# Patient Record
Sex: Female | Born: 1970 | Race: Black or African American | Hispanic: No | Marital: Married | Smoking: Never smoker
Health system: Southern US, Community
[De-identification: ages and names within clinical notes are randomized; demographics above are authoritative.]

## PROBLEM LIST (undated history)

## (undated) DIAGNOSIS — K219 Gastro-esophageal reflux disease without esophagitis: Secondary | ICD-10-CM

## (undated) HISTORY — PX: CRYOTHERAPY: SHX1416

## (undated) HISTORY — PX: OTHER SURGICAL HISTORY: SHX169

---

## 1999-05-13 ENCOUNTER — Emergency Department (HOSPITAL_COMMUNITY): Admission: EM | Admit: 1999-05-13 | Discharge: 1999-05-13 | Payer: Self-pay | Admitting: Emergency Medicine

## 1999-11-08 ENCOUNTER — Emergency Department (HOSPITAL_COMMUNITY): Admission: EM | Admit: 1999-11-08 | Discharge: 1999-11-08 | Payer: Self-pay | Admitting: Emergency Medicine

## 2002-11-28 ENCOUNTER — Inpatient Hospital Stay (HOSPITAL_COMMUNITY): Admission: AD | Admit: 2002-11-28 | Discharge: 2002-11-28 | Payer: Self-pay | Admitting: Obstetrics and Gynecology

## 2003-04-09 ENCOUNTER — Encounter: Admission: RE | Admit: 2003-04-09 | Discharge: 2003-04-09 | Payer: Self-pay | Admitting: Obstetrics & Gynecology

## 2004-09-25 ENCOUNTER — Other Ambulatory Visit: Admission: RE | Admit: 2004-09-25 | Discharge: 2004-09-25 | Payer: Self-pay | Admitting: Obstetrics and Gynecology

## 2005-07-26 ENCOUNTER — Other Ambulatory Visit: Admission: RE | Admit: 2005-07-26 | Discharge: 2005-07-26 | Payer: Self-pay | Admitting: Family Medicine

## 2005-08-15 ENCOUNTER — Ambulatory Visit (HOSPITAL_COMMUNITY): Admission: RE | Admit: 2005-08-15 | Discharge: 2005-08-15 | Payer: Self-pay | Admitting: Family Medicine

## 2006-04-01 ENCOUNTER — Ambulatory Visit (HOSPITAL_COMMUNITY): Admission: RE | Admit: 2006-04-01 | Discharge: 2006-04-01 | Payer: Self-pay | Admitting: Obstetrics and Gynecology

## 2007-07-14 ENCOUNTER — Emergency Department (HOSPITAL_COMMUNITY): Admission: EM | Admit: 2007-07-14 | Discharge: 2007-07-14 | Payer: Self-pay

## 2009-07-09 ENCOUNTER — Emergency Department (HOSPITAL_COMMUNITY): Admission: EM | Admit: 2009-07-09 | Discharge: 2009-07-09 | Payer: Self-pay | Admitting: Emergency Medicine

## 2009-08-18 ENCOUNTER — Ambulatory Visit (HOSPITAL_COMMUNITY): Admission: RE | Admit: 2009-08-18 | Discharge: 2009-08-18 | Payer: Self-pay | Admitting: Obstetrics & Gynecology

## 2010-07-17 ENCOUNTER — Ambulatory Visit: Payer: Self-pay | Admitting: Nurse Practitioner

## 2010-07-17 ENCOUNTER — Inpatient Hospital Stay (HOSPITAL_COMMUNITY): Admission: AD | Admit: 2010-07-17 | Discharge: 2010-07-17 | Payer: Self-pay | Admitting: Family Medicine

## 2010-11-11 ENCOUNTER — Encounter: Payer: Self-pay | Admitting: Family Medicine

## 2010-11-11 ENCOUNTER — Encounter: Payer: Self-pay | Admitting: Obstetrics and Gynecology

## 2011-01-04 LAB — URINALYSIS, ROUTINE W REFLEX MICROSCOPIC
Bilirubin Urine: NEGATIVE
Glucose, UA: NEGATIVE mg/dL
Ketones, ur: NEGATIVE mg/dL
Leukocytes, UA: NEGATIVE
Nitrite: NEGATIVE
Protein, ur: NEGATIVE mg/dL
Specific Gravity, Urine: 1.01 (ref 1.005–1.030)
Urobilinogen, UA: 0.2 mg/dL (ref 0.0–1.0)
pH: 6 (ref 5.0–8.0)

## 2011-01-04 LAB — WET PREP, GENITAL
Clue Cells Wet Prep HPF POC: NONE SEEN
Trich, Wet Prep: NONE SEEN
Yeast Wet Prep HPF POC: NONE SEEN

## 2011-01-04 LAB — GC/CHLAMYDIA PROBE AMP, GENITAL
Chlamydia, DNA Probe: NEGATIVE
GC Probe Amp, Genital: NEGATIVE

## 2011-01-04 LAB — POCT PREGNANCY, URINE: Preg Test, Ur: POSITIVE

## 2011-01-04 LAB — CBC
HCT: 36.1 % (ref 36.0–46.0)
Hemoglobin: 12.4 g/dL (ref 12.0–15.0)
MCH: 32.6 pg (ref 26.0–34.0)
MCHC: 34.4 g/dL (ref 30.0–36.0)
MCV: 94.9 fL (ref 78.0–100.0)
Platelets: 179 10*3/uL (ref 150–400)
RBC: 3.8 MIL/uL — ABNORMAL LOW (ref 3.87–5.11)
RDW: 12.5 % (ref 11.5–15.5)
WBC: 8.2 10*3/uL (ref 4.0–10.5)

## 2011-01-04 LAB — URINE MICROSCOPIC-ADD ON

## 2011-01-04 LAB — HCG, QUANTITATIVE, PREGNANCY: hCG, Beta Chain, Quant, S: 39998 m[IU]/mL — ABNORMAL HIGH (ref <5)

## 2011-01-04 LAB — ABO/RH: ABO/RH(D): B POS

## 2011-03-15 ENCOUNTER — Inpatient Hospital Stay (HOSPITAL_COMMUNITY): Payer: Medicaid Other

## 2011-03-15 ENCOUNTER — Inpatient Hospital Stay (HOSPITAL_COMMUNITY)
Admission: AD | Admit: 2011-03-15 | Discharge: 2011-03-17 | DRG: 775 | Disposition: A | Discharge status: Home / Self Care | Payer: Medicaid Other | Source: Ambulatory Visit | Attending: Obstetrics and Gynecology | Admitting: Obstetrics and Gynecology

## 2011-03-15 DIAGNOSIS — D259 Leiomyoma of uterus, unspecified: Secondary | ICD-10-CM | POA: Diagnosis present

## 2011-03-15 DIAGNOSIS — O365990 Maternal care for other known or suspected poor fetal growth, unspecified trimester, not applicable or unspecified: Secondary | ICD-10-CM | POA: Diagnosis present

## 2011-03-15 DIAGNOSIS — O34599 Maternal care for other abnormalities of gravid uterus, unspecified trimester: Secondary | ICD-10-CM | POA: Diagnosis present

## 2011-03-15 DIAGNOSIS — D4959 Neoplasm of unspecified behavior of other genitourinary organ: Secondary | ICD-10-CM | POA: Diagnosis present

## 2011-03-15 DIAGNOSIS — O09529 Supervision of elderly multigravida, unspecified trimester: Secondary | ICD-10-CM | POA: Diagnosis present

## 2011-03-15 LAB — COMPREHENSIVE METABOLIC PANEL
ALT: 23 U/L (ref 0–35)
AST: 19 U/L (ref 0–37)
Albumin: 2.9 g/dL — ABNORMAL LOW (ref 3.5–5.2)
Alkaline Phosphatase: 126 U/L — ABNORMAL HIGH (ref 39–117)
BUN: 8 mg/dL (ref 6–23)
CO2: 23 mEq/L (ref 19–32)
Calcium: 8.9 mg/dL (ref 8.4–10.5)
Chloride: 103 mEq/L (ref 96–112)
Creatinine, Ser: 0.58 mg/dL (ref 0.4–1.2)
GFR calc Af Amer: >60 mL/min (ref >60)
GFR calc non Af Amer: >60 mL/min (ref >60)
Glucose, Bld: 86 mg/dL (ref 70–99)
Potassium: 3.8 mEq/L (ref 3.5–5.1)
Sodium: 135 mEq/L (ref 135–145)
Total Bilirubin: 0.2 mg/dL — ABNORMAL LOW (ref 0.3–1.2)
Total Protein: 5.9 g/dL — ABNORMAL LOW (ref 6.0–8.3)

## 2011-03-15 LAB — CBC
HCT: 37 % (ref 36.0–46.0)
HCT: 37.1 % (ref 36.0–46.0)
Hemoglobin: 12.5 g/dL (ref 12.0–15.0)
Hemoglobin: 12.8 g/dL (ref 12.0–15.0)
MCH: 31.2 pg (ref 26.0–34.0)
MCH: 31.7 pg (ref 26.0–34.0)
MCHC: 33.8 g/dL (ref 30.0–36.0)
MCHC: 34.5 g/dL (ref 30.0–36.0)
MCV: 92.3 fL (ref 78.0–100.0)
MCV: 91.8 fL (ref 78.0–100.0)
Platelets: 155 10*3/uL (ref 150–400)
Platelets: 157 10*3/uL (ref 150–400)
RBC: 4.01 MIL/uL (ref 3.87–5.11)
RBC: 4.04 MIL/uL (ref 3.87–5.11)
RDW: 12.9 % (ref 11.5–15.5)
RDW: 13 % (ref 11.5–15.5)
WBC: 8 10*3/uL (ref 4.0–10.5)
WBC: 9.6 10*3/uL (ref 4.0–10.5)

## 2011-03-15 LAB — RPR: RPR Ser Ql: NONREACTIVE

## 2011-03-16 LAB — ABO/RH: ABO/RH(D): B POS

## 2011-03-16 LAB — CBC
HCT: 34.8 % — ABNORMAL LOW (ref 36.0–46.0)
Hemoglobin: 11.5 g/dL — ABNORMAL LOW (ref 12.0–15.0)
MCHC: 33 g/dL (ref 30.0–36.0)
RBC: 3.75 MIL/uL — ABNORMAL LOW (ref 3.87–5.11)
WBC: 14.2 10*3/uL — ABNORMAL HIGH (ref 4.0–10.5)

## 2011-05-25 IMAGING — CR DG FOOT COMPLETE 3+V*L*
3 series · 3 of 3 positions shown · non-contrast
Comparison: None

CLINICAL DATA: Left foot pain and swelling, about the second to
fourth metatarsophalangeal joints.

LEFT FOOT - COMPLETE 3+ VIEW

[t foot ap left]
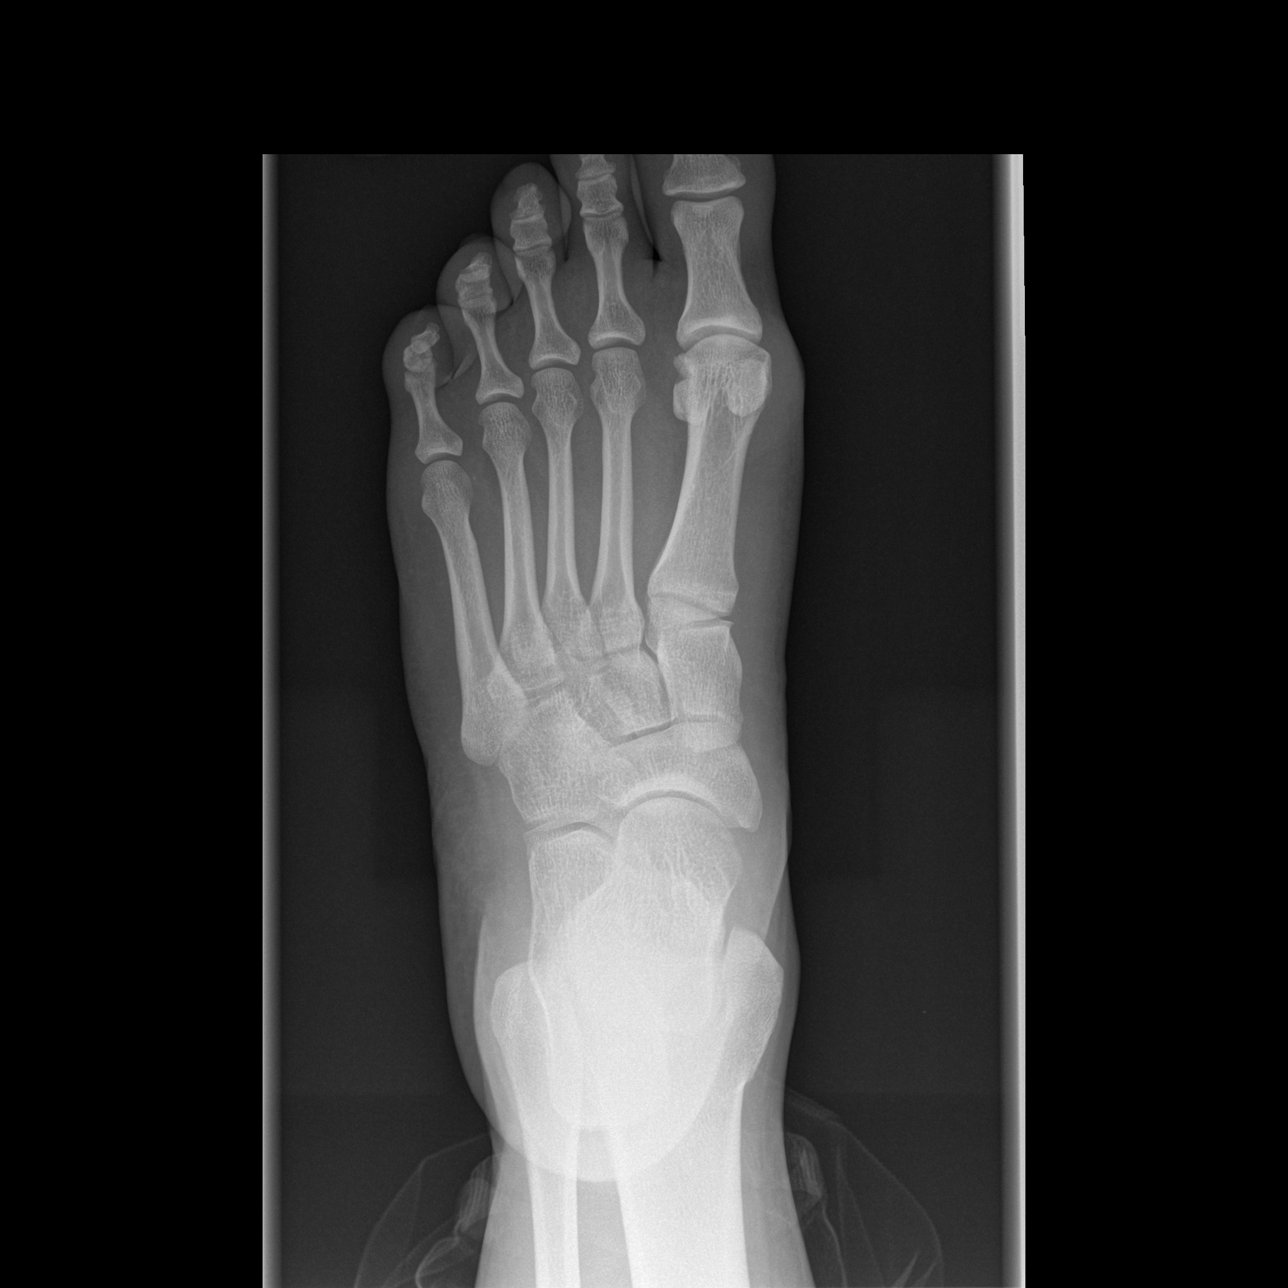

[t foot oblique left]
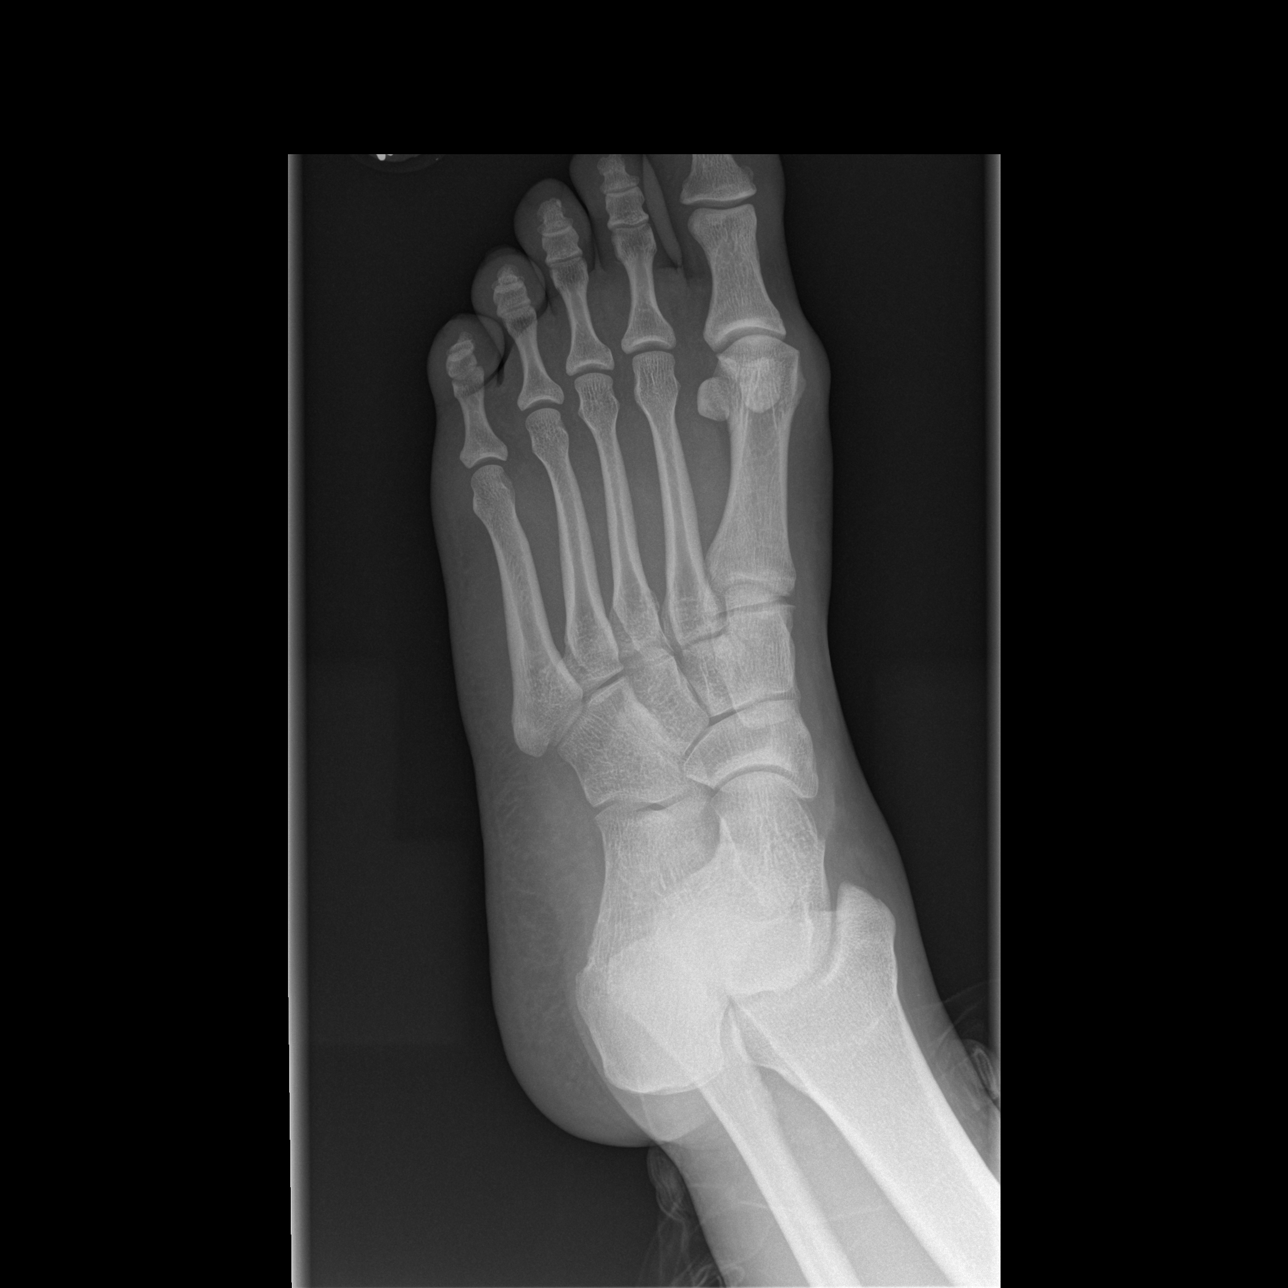

[t foot lat left]
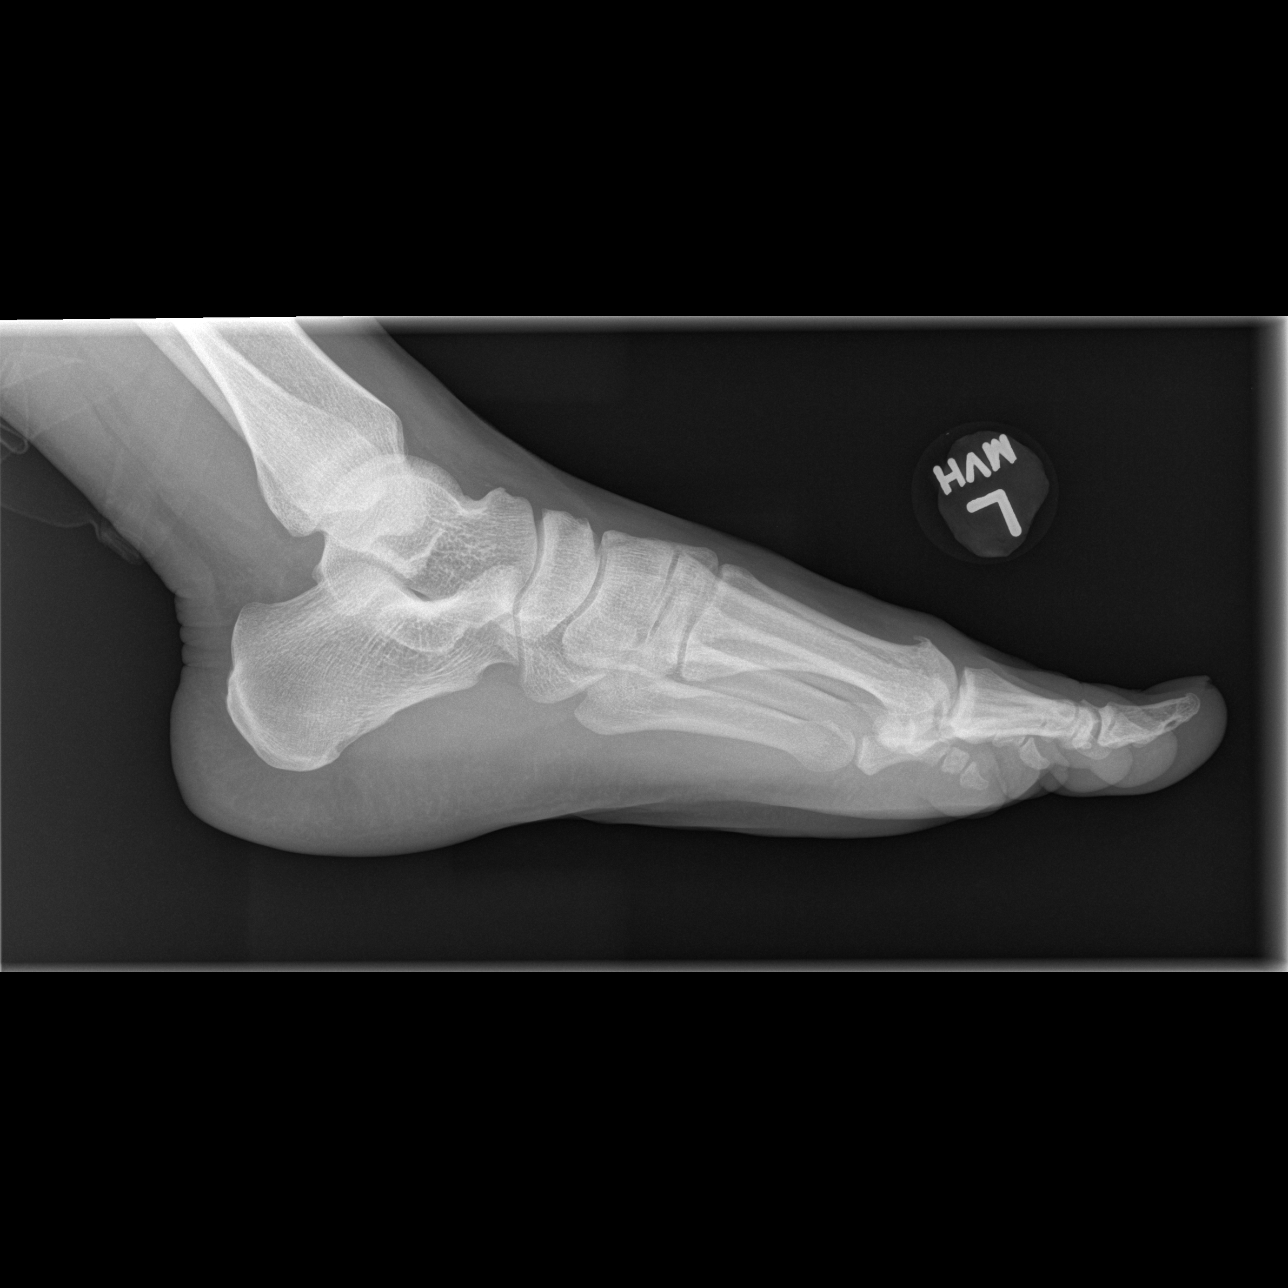

[3 of 3 positions shown; findings below may reference images not displayed]

FINDINGS: There is no evidence of fracture or dislocation.  The
joint spaces are preserved.  There is no evidence of talar
subluxation; the subtalar joint is unremarkable in appearance.

Mild soft tissue swelling may be present about the forefoot.
IMPRESSION: No evidence of fracture or dislocation.

## 2011-12-13 ENCOUNTER — Encounter (HOSPITAL_COMMUNITY): Payer: Self-pay | Admitting: Pharmacist

## 2011-12-26 ENCOUNTER — Encounter (HOSPITAL_COMMUNITY)
Admission: RE | Admit: 2011-12-26 | Discharge: 2011-12-26 | Disposition: A | Discharge status: Home / Self Care | Payer: 59 | Source: Ambulatory Visit | Attending: Obstetrics and Gynecology | Admitting: Obstetrics and Gynecology

## 2011-12-26 ENCOUNTER — Encounter (HOSPITAL_COMMUNITY): Payer: Self-pay

## 2011-12-26 HISTORY — DX: Gastro-esophageal reflux disease without esophagitis: K21.9

## 2011-12-26 LAB — CBC
Hemoglobin: 13 g/dL (ref 12.0–15.0)
MCH: 31.3 pg (ref 26.0–34.0)
MCV: 91.1 fL (ref 78.0–100.0)
Platelets: 173 10*3/uL (ref 150–400)
RBC: 4.16 MIL/uL (ref 3.87–5.11)
WBC: 4.9 10*3/uL (ref 4.0–10.5)

## 2011-12-26 NOTE — Patient Instructions (Addendum)
19 Brianna Fry  12/26/2011   Your procedure is scheduled on:  01/02/12  Enter through the Main Entrance of [facility] at 7 AM.  Pick up the phone at the desk and dial [phone].   Call this number if you have problems the morning of surgery: [phone]   Remember:   Do not eat food:After Midnight.  Do not drink clear liquids: After Midnight.  Take these medicines the morning of surgery with A SIP OF WATER: NA   Do not wear jewelry, make-up or nail polish.  Do not wear lotions, powders, or perfumes. You may wear deodorant.  Do not shave 48 hours prior to surgery.  Do not bring valuables to the hospital.  Contacts, dentures or bridgework may not be worn into surgery.  Leave suitcase in the car. After surgery it may be brought to your room.  For patients admitted to the hospital, checkout time is 11:00 AM the day of discharge.   Patients discharged the day of surgery will not be allowed to drive home.  Name and phone number of your driver: NA  Special Instructions: CHG Shower Use Special Wash: 1/2 bottle night before surgery and 1/2 bottle morning of surgery.   Please read over the following fact sheets that you were given: MRSA Information

## 2012-01-01 ENCOUNTER — Other Ambulatory Visit: Payer: Self-pay | Admitting: Obstetrics and Gynecology

## 2012-01-01 MED ORDER — CEFOTETAN DISODIUM 2 G IJ SOLR
2.0000 g | INTRAMUSCULAR | Status: AC
Start: 2012-01-02 — End: 2012-01-02
  Administered 2012-01-02: 2 g via INTRAVENOUS
  Filled 2012-01-01: qty 2

## 2012-01-01 NOTE — H&P (Signed)
41 y.o. yo complains of symptomatic fibroid uterus.  Pt c/o some urinary sx associated with enlarging fibroids.  She has  menorrhagia and clots with periods.  She is also having dyspareunia.  Past Medical History  Diagnosis Date  . GERD (gastroesophageal reflux disease)     controlled now with diet   Past Surgical History  Procedure Date  . Cryotherapy     History   Social History  . Marital Status: Married    Spouse Name: N/A    Number of Children: N/A  . Years of Education: N/A   Occupational History  . Not on file.   Social History Main Topics  . Smoking status: Never Smoker   . Smokeless tobacco: Not on file  . Alcohol Use: Yes  . Drug Use: No  . Sexually Active:    Other Topics Concern  . Not on file   Social History Narrative  . No narrative on file    No current facility-administered medications on file prior to encounter.   No current outpatient prescriptions on file prior to encounter.    No Known Allergies  Filed Vitals:   01/02/12 0737  BP: 115/72  Pulse: 70  Temp: 98.2 F (36.8 C)  Resp: 18     Lungs: clear to ascultation Cor:  RRR Abdomen:  soft, nontender, nondistended. Ex:  no cords, erythema Pelvic:  14 week size, mobile uterus w/o discreet masses.  Cervix is clear.  CBC    Component Value Date/Time   WBC 4.9 12/26/2011 1157   RBC 4.16 12/26/2011 1157   HGB 13.0 12/26/2011 1157   HCT 37.9 12/26/2011 1157   PLT 173 12/26/2011 1157   MCV 91.1 12/26/2011 1157   MCH 31.3 12/26/2011 1157   MCHC 34.3 12/26/2011 1157   RDW 12.0 12/26/2011 1157      U/S  During pregnancy, multiple fibroids, largest 10cm in fundus.  Two 5 cm fibroids- some pedunculated.  A: Symptomatic fibroid uterus.  P:  For RoboticTLH.   All risks, benefits and alternatives d/w patient and she desires to proceed .  Patient has undergone a modified bowel prep and will receive preop antibiotics and SCDs during the operation.     Jilleen Essner A

## 2012-01-02 ENCOUNTER — Encounter (HOSPITAL_COMMUNITY): Payer: Self-pay | Admitting: Anesthesiology

## 2012-01-02 ENCOUNTER — Encounter (HOSPITAL_COMMUNITY)
Admission: RE | Disposition: A | Discharge status: Home / Self Care | Payer: Self-pay | Source: Ambulatory Visit | Attending: Obstetrics and Gynecology

## 2012-01-02 ENCOUNTER — Ambulatory Visit (HOSPITAL_COMMUNITY)
Admission: RE | Admit: 2012-01-02 | Discharge: 2012-01-03 | Disposition: A | Discharge status: Home / Self Care | Payer: 59 | Source: Ambulatory Visit | Attending: Obstetrics and Gynecology | Admitting: Obstetrics and Gynecology

## 2012-01-02 ENCOUNTER — Encounter (HOSPITAL_COMMUNITY): Payer: Self-pay | Admitting: *Deleted

## 2012-01-02 ENCOUNTER — Ambulatory Visit (HOSPITAL_COMMUNITY): Payer: 59 | Admitting: Anesthesiology

## 2012-01-02 DIAGNOSIS — IMO0002 Reserved for concepts with insufficient information to code with codable children: Secondary | ICD-10-CM | POA: Insufficient documentation

## 2012-01-02 DIAGNOSIS — D250 Submucous leiomyoma of uterus: Secondary | ICD-10-CM | POA: Insufficient documentation

## 2012-01-02 DIAGNOSIS — Z01818 Encounter for other preprocedural examination: Secondary | ICD-10-CM | POA: Insufficient documentation

## 2012-01-02 DIAGNOSIS — N8000 Endometriosis of the uterus, unspecified: Secondary | ICD-10-CM | POA: Insufficient documentation

## 2012-01-02 DIAGNOSIS — Z01812 Encounter for preprocedural laboratory examination: Secondary | ICD-10-CM | POA: Insufficient documentation

## 2012-01-02 DIAGNOSIS — Z9889 Other specified postprocedural states: Secondary | ICD-10-CM

## 2012-01-02 DIAGNOSIS — D252 Subserosal leiomyoma of uterus: Secondary | ICD-10-CM | POA: Insufficient documentation

## 2012-01-02 DIAGNOSIS — D251 Intramural leiomyoma of uterus: Secondary | ICD-10-CM | POA: Insufficient documentation

## 2012-01-02 DIAGNOSIS — N920 Excessive and frequent menstruation with regular cycle: Secondary | ICD-10-CM | POA: Insufficient documentation

## 2012-01-02 HISTORY — PX: ROBOTIC ASSISTED LAP VAGINAL HYSTERECTOMY: SHX2362

## 2012-01-02 LAB — PREGNANCY, URINE: Preg Test, Ur: NEGATIVE

## 2012-01-02 SURGERY — ROBOTIC ASSISTED LAPAROSCOPIC VAGINAL HYSTERECTOMY
Anesthesia: General | Site: Abdomen | Wound class: Clean Contaminated

## 2012-01-02 MED ORDER — LACTATED RINGERS IV SOLN
INTRAVENOUS | Status: DC
  Administered 2012-01-02: 11:00:00 via INTRAVENOUS
  Administered 2012-01-02: 125 mL/h via INTRAVENOUS

## 2012-01-02 MED ORDER — HYDROMORPHONE HCL PF 1 MG/ML IJ SOLN
0.2500 mg | INTRAMUSCULAR | Status: DC | PRN
  Administered 2012-01-02 (×2): 0.5 mg via INTRAVENOUS

## 2012-01-02 MED ORDER — ONDANSETRON HCL 4 MG/2ML IJ SOLN
INTRAMUSCULAR | Status: DC | PRN
  Administered 2012-01-02: 4 mg via INTRAVENOUS

## 2012-01-02 MED ORDER — MIDAZOLAM HCL 2 MG/2ML IJ SOLN
INTRAMUSCULAR | Status: AC
  Filled 2012-01-02: qty 2

## 2012-01-02 MED ORDER — ROCURONIUM BROMIDE 100 MG/10ML IV SOLN
INTRAVENOUS | Status: DC | PRN
  Administered 2012-01-02: 5 mg via INTRAVENOUS
  Administered 2012-01-02: 20 mg via INTRAVENOUS
  Administered 2012-01-02: 10 mg via INTRAVENOUS
  Administered 2012-01-02: 5 mg via INTRAVENOUS
  Administered 2012-01-02: 35 mg via INTRAVENOUS

## 2012-01-02 MED ORDER — LIDOCAINE HCL (CARDIAC) 20 MG/ML IV SOLN
INTRAVENOUS | Status: DC | PRN
  Administered 2012-01-02: 80 mg via INTRAVENOUS

## 2012-01-02 MED ORDER — KETOROLAC TROMETHAMINE 30 MG/ML IJ SOLN: 15.0000 mg | Freq: Once | INTRAMUSCULAR | Status: DC | PRN

## 2012-01-02 MED ORDER — ONDANSETRON HCL 4 MG/2ML IJ SOLN
INTRAMUSCULAR | Status: AC
  Filled 2012-01-02: qty 2

## 2012-01-02 MED ORDER — GLYCOPYRROLATE 0.2 MG/ML IJ SOLN
INTRAMUSCULAR | Status: AC
  Filled 2012-01-02: qty 1

## 2012-01-02 MED ORDER — MIDAZOLAM HCL 5 MG/5ML IJ SOLN
INTRAMUSCULAR | Status: DC | PRN
  Administered 2012-01-02: 2 mg via INTRAVENOUS

## 2012-01-02 MED ORDER — KETOROLAC TROMETHAMINE 30 MG/ML IJ SOLN
INTRAMUSCULAR | Status: DC | PRN
  Administered 2012-01-02: 30 mg via INTRAVENOUS

## 2012-01-02 MED ORDER — MENTHOL 3 MG MT LOZG: 1.0000 | LOZENGE | OROMUCOSAL | Status: DC | PRN

## 2012-01-02 MED ORDER — ONDANSETRON HCL 4 MG/2ML IJ SOLN: 4.0000 mg | Freq: Four times a day (QID) | INTRAMUSCULAR | Status: DC | PRN

## 2012-01-02 MED ORDER — ZOLPIDEM TARTRATE 5 MG PO TABS
5.0000 mg | ORAL_TABLET | Freq: Every evening | ORAL | Status: DC | PRN
  Filled 2012-01-02: qty 5

## 2012-01-02 MED ORDER — NEOSTIGMINE METHYLSULFATE 1 MG/ML IJ SOLN
INTRAMUSCULAR | Status: DC | PRN
  Administered 2012-01-02: 5 mg via INTRAVENOUS

## 2012-01-02 MED ORDER — DEXAMETHASONE SODIUM PHOSPHATE 10 MG/ML IJ SOLN
INTRAMUSCULAR | Status: AC
  Filled 2012-01-02: qty 1

## 2012-01-02 MED ORDER — ONDANSETRON HCL 4 MG PO TABS: 4.0000 mg | ORAL_TABLET | Freq: Four times a day (QID) | ORAL | Status: DC | PRN

## 2012-01-02 MED ORDER — LACTATED RINGERS IR SOLN
Status: DC | PRN
  Administered 2012-01-02: 3000 mL

## 2012-01-02 MED ORDER — KETOROLAC TROMETHAMINE 30 MG/ML IJ SOLN: 30.0000 mg | Freq: Four times a day (QID) | INTRAMUSCULAR | Status: DC

## 2012-01-02 MED ORDER — HYDROMORPHONE HCL PF 1 MG/ML IJ SOLN
INTRAMUSCULAR | Status: AC
  Administered 2012-01-02: 0.5 mg via INTRAVENOUS
  Filled 2012-01-02: qty 1

## 2012-01-02 MED ORDER — GLYCOPYRROLATE 0.2 MG/ML IJ SOLN
INTRAMUSCULAR | Status: DC | PRN
  Administered 2012-01-02: .5 mg via INTRAVENOUS

## 2012-01-02 MED ORDER — FENTANYL CITRATE 0.05 MG/ML IJ SOLN
INTRAMUSCULAR | Status: DC | PRN
  Administered 2012-01-02: 100 ug via INTRAVENOUS
  Administered 2012-01-02: 150 ug via INTRAVENOUS

## 2012-01-02 MED ORDER — PROMETHAZINE HCL 25 MG/ML IJ SOLN: 6.2500 mg | INTRAMUSCULAR | Status: DC | PRN

## 2012-01-02 MED ORDER — IBUPROFEN 800 MG PO TABS: 800.0000 mg | ORAL_TABLET | Freq: Three times a day (TID) | ORAL | Status: DC | PRN

## 2012-01-02 MED ORDER — FENTANYL CITRATE 0.05 MG/ML IJ SOLN: 25.0000 ug | INTRAMUSCULAR | Status: DC | PRN

## 2012-01-02 MED ORDER — KETOROLAC TROMETHAMINE 30 MG/ML IJ SOLN
INTRAMUSCULAR | Status: AC
  Filled 2012-01-02: qty 1

## 2012-01-02 MED ORDER — PROPOFOL 10 MG/ML IV EMUL
INTRAVENOUS | Status: DC | PRN
  Administered 2012-01-02: 130 mg via INTRAVENOUS
  Administered 2012-01-02: 20 mg via INTRAVENOUS

## 2012-01-02 MED ORDER — NEOSTIGMINE METHYLSULFATE 1 MG/ML IJ SOLN
INTRAMUSCULAR | Status: AC
  Filled 2012-01-02: qty 10

## 2012-01-02 MED ORDER — PROPOFOL 10 MG/ML IV EMUL
INTRAVENOUS | Status: AC
  Filled 2012-01-02: qty 20

## 2012-01-02 MED ORDER — FENTANYL CITRATE 0.05 MG/ML IJ SOLN
INTRAMUSCULAR | Status: AC
  Filled 2012-01-02: qty 5

## 2012-01-02 MED ORDER — MEPERIDINE HCL 25 MG/ML IJ SOLN: 6.2500 mg | INTRAMUSCULAR | Status: DC | PRN

## 2012-01-02 MED ORDER — DEXAMETHASONE SODIUM PHOSPHATE 4 MG/ML IJ SOLN
INTRAMUSCULAR | Status: DC | PRN
  Administered 2012-01-02: 10 mg via INTRAVENOUS

## 2012-01-02 MED ORDER — OXYCODONE-ACETAMINOPHEN 5-325 MG PO TABS
1.0000 | ORAL_TABLET | ORAL | Status: DC | PRN
  Administered 2012-01-02: 2 via ORAL
  Filled 2012-01-02: qty 2

## 2012-01-02 MED ORDER — LIDOCAINE HCL (CARDIAC) 20 MG/ML IV SOLN
INTRAVENOUS | Status: AC
  Filled 2012-01-02: qty 5

## 2012-01-02 MED ORDER — ROCURONIUM BROMIDE 50 MG/5ML IV SOLN
INTRAVENOUS | Status: AC
  Filled 2012-01-02: qty 1

## 2012-01-02 SURGICAL SUPPLY — 68 items
BAG URINE DRAINAGE (UROLOGICAL SUPPLIES) ×2 IMPLANT
BARRIER ADHS 3X4 INTERCEED (GAUZE/BANDAGES/DRESSINGS) IMPLANT
BENZOIN TINCTURE PRP APPL 2/3 (GAUZE/BANDAGES/DRESSINGS) IMPLANT
BLADELESS LONG 8MM (BLADE) ×2 IMPLANT
CABLE HIGH FREQUENCY MONO STRZ (ELECTRODE) ×2 IMPLANT
CATH FOLEY 3WAY  5CC 16FR (CATHETERS) ×1
CATH FOLEY 3WAY 5CC 16FR (CATHETERS) ×1 IMPLANT
CHLORAPREP W/TINT 26ML (MISCELLANEOUS) ×2 IMPLANT
CLOTH BEACON ORANGE TIMEOUT ST (SAFETY) ×2 IMPLANT
CONT PATH 16OZ SNAP LID 3702 (MISCELLANEOUS) ×2 IMPLANT
COVER MAYO STAND STRL (DRAPES) ×2 IMPLANT
COVER TABLE BACK 60X90 (DRAPES) ×4 IMPLANT
COVER TIP SHEARS 8 DVNC (MISCELLANEOUS) ×1 IMPLANT
COVER TIP SHEARS 8MM DA VINCI (MISCELLANEOUS) ×1
DECANTER SPIKE VIAL GLASS SM (MISCELLANEOUS) IMPLANT
DERMABOND ADVANCED (GAUZE/BANDAGES/DRESSINGS) ×1
DERMABOND ADVANCED .7 DNX12 (GAUZE/BANDAGES/DRESSINGS) ×1 IMPLANT
DRAPE HUG U DISPOSABLE (DRAPE) ×2 IMPLANT
DRAPE HYSTEROSCOPY (DRAPE) ×2 IMPLANT
DRAPE LG THREE QUARTER DISP (DRAPES) ×4 IMPLANT
DRAPE MONITOR DA VINCI (DRAPE) IMPLANT
DRAPE WARM FLUID 44X44 (DRAPE) ×2 IMPLANT
ELECT REM PT RETURN 9FT ADLT (ELECTROSURGICAL) ×2
ELECTRODE REM PT RTRN 9FT ADLT (ELECTROSURGICAL) ×1 IMPLANT
EVACUATOR SMOKE 8.L (FILTER) ×2 IMPLANT
GAUZE VASELINE 3X9 (GAUZE/BANDAGES/DRESSINGS) IMPLANT
GLOVE BIO SURGEON STRL SZ7 (GLOVE) ×6 IMPLANT
GLOVE ECLIPSE 6.5 STRL STRAW (GLOVE) ×6 IMPLANT
GOWN PREVENTION PLUS LG XLONG (DISPOSABLE) ×8 IMPLANT
GOWN STRL REIN XL XLG (GOWN DISPOSABLE) ×12 IMPLANT
KIT ACCESSORY DA VINCI DISP (KITS) ×1
KIT ACCESSORY DVNC DISP (KITS) ×1 IMPLANT
KIT DISP ACCESSORY 4 ARM (KITS) IMPLANT
MANIPULATOR UTERINE 4.5 ZUMI (MISCELLANEOUS) IMPLANT
NEEDLE INSUFFLATION 14GA 120MM (NEEDLE) ×2 IMPLANT
NS IRRIG 1000ML POUR BTL (IV SOLUTION) ×6 IMPLANT
OCCLUDER COLPOPNEUMO (BALLOONS) ×2 IMPLANT
PACK LAVH (CUSTOM PROCEDURE TRAY) ×2 IMPLANT
PAD PREP 24X48 CUFFED NSTRL (MISCELLANEOUS) ×4 IMPLANT
PLUG CATH AND CAP STER (CATHETERS) ×2 IMPLANT
POSITIONER SURGICAL ARM (MISCELLANEOUS) IMPLANT
PROTECTOR NERVE ULNAR (MISCELLANEOUS) ×4 IMPLANT
RUMI II 3.0CM BLUE KOH-EFFICIE (DISPOSABLE) ×2 IMPLANT
SET IRRIG TUBING LAPAROSCOPIC (IRRIGATION / IRRIGATOR) ×2 IMPLANT
SOLUTION ELECTROLUBE (MISCELLANEOUS) ×2 IMPLANT
SPONGE LAP 18X18 X RAY DECT (DISPOSABLE) IMPLANT
STRIP CLOSURE SKIN 1/2X4 (GAUZE/BANDAGES/DRESSINGS) IMPLANT
SUT VIC AB 0 CT1 27 (SUTURE) ×5
SUT VIC AB 0 CT1 27XBRD ANBCTR (SUTURE) ×5 IMPLANT
SUT VIC AB 2-0 CT2 27 (SUTURE) ×4 IMPLANT
SUT VICRYL 0 UR6 27IN ABS (SUTURE) ×4 IMPLANT
SUT VICRYL RAPIDE 3 0 (SUTURE) ×4 IMPLANT
SUT VLOC 180 0 9IN  GS21 (SUTURE) ×1
SUT VLOC 180 0 9IN GS21 (SUTURE) ×1 IMPLANT
SYR 50ML LL SCALE MARK (SYRINGE) ×2 IMPLANT
SYSTEM CONVERTIBLE TROCAR (TROCAR) ×2 IMPLANT
TIP UTERINE 5.1X6CM LAV DISP (MISCELLANEOUS) IMPLANT
TIP UTERINE 6.7X10CM GRN DISP (MISCELLANEOUS) IMPLANT
TIP UTERINE 6.7X6CM WHT DISP (MISCELLANEOUS) IMPLANT
TIP UTERINE 6.7X8CM BLUE DISP (MISCELLANEOUS) ×2 IMPLANT
TOWEL OR 17X24 6PK STRL BLUE (TOWEL DISPOSABLE) ×4 IMPLANT
TRAY FOLEY BAG SILVER LF 14FR (CATHETERS) IMPLANT
TROCAR DISP BLADELESS 8 DVNC (TROCAR) ×1 IMPLANT
TROCAR DISP BLADELESS 8MM (TROCAR) ×1
TROCAR XCEL 12X100 BLDLESS (ENDOMECHANICALS) ×2 IMPLANT
TROCAR Z-THREAD 12X150 (TROCAR) ×2 IMPLANT
TROCAR Z-THREAD BLADED 12X100M (TROCAR) IMPLANT
TUBING FILTER THERMOFLATOR (ELECTROSURGICAL) ×2 IMPLANT

## 2012-01-02 NOTE — Brief Op Note (Signed)
01/02/2012  11:09 AM  PATIENT:  Brianna Fry  41 y.o. female  PRE-OPERATIVE DIAGNOSIS:  UTERINE FIBROIDS  POST-OPERATIVE DIAGNOSIS:  uterine fibroids  PROCEDURE:  Procedure(s) (LRB): ROBOTIC ASSISTED LAPAROSCOPIC VAGINAL HYSTERECTOMY (N/A)   SURGEON:  Surgeon(s) and Role:    * Loney Laurence, MD - Primary    * Philip Aspen, DO - Assisting  ANESTHESIA:   general  EBL:  Total I/O In: 1000 [I.V.:1000] Out: -   SPECIMEN:  Source of Specimen:  uterus, cervix  DISPOSITION OF SPECIMEN:  PATHOLOGY  COUNTS:  YES  PLAN OF CARE: Admit for overnight observation  PATIENT DISPOSITION:  PACU - hemodynamically stable.   Delay start of Pharmacological VTE agent (>24hrs) due to surgical blood loss or risk of bleeding: not applicable  Preoperative Diagnosis:  Symptomatic fibroid uterus.  Postoperative Diagnosis:  Same.  Procedure:  Robotic Assisted Supracervical Hysterectomy with bilateral salpingectomies and Left oophorectomy.  Attending: Loney Laurence  Assistant: Leilani Able, PA  Anesthesia:  General endotracheal.  Estimated blood loss:  200 cc.  IV fluids:  1500 cc.  Urine:  700 cc.  Complications:  None.  Findings:  15 weeks bulky uterus with multiple fibroids.  The ureters were identified during multiple points of the case and were always out of the field of dissection.    Medications:  Cefotan.      Technique:  After adequate anesthesia was achieved the patient was positioned, prepped and draped in usual sterile fashion.  A speculum was placed in the vagina and the cervix dilated with pratt dilators.  The cervix was secured with a stitch of 2-0 vicryl and the 8 cm Rumi and 3 cm Koh ring were assembled and placed in proper fashion.  The  Speculum was removed and the bladder catheterized with a foley.    Attention was turned to the abdomen where a 1 cm incision was made 5 cm above the umbilicus.  The fascia was tented up and incised with a  scalpel.  Each angle was secured with a stitch of 2-vicryl and the peritoneum entered into bluntly with my finger.  The long 12 mm trocar was placed and the other four trocar sites were marked out, all approximately 10 cm from each other and the camera.  Three 8.5 mm trocars were placed on either side of the camera port and 3 cm above the iliac crest on the right side.  A 5 mm assistant port was placed opposite the left lower trocar.  All trocars were inserted under direct visualization of the camera.  The patient was placed in trendelenburg and then the Robot docked.  The PK forceps were placed on arm 2, the Prograsp on arm 3 and the Hot shears on arm 1 and introduced under direct visualization of the camera  I then broke scrub and sat down at the console.  The above findings were noted and the ureters identified well out of the field of dissection.  The round ligament on the right side was cauterized and incised, followed by the tube and uterine ovarian ligament.   Each was cauterized with the PK and then divided with the scissors.  The posterior broad ligament was then divided with the hot shears until the uterosacral ligament.  The anterior leaf was then incised at the reflection of the vessico-uterine junction and the bladder retracted inferiorly.The same procedure was performed on the left side.   At the level of the internal os, the uterine arteries were bilaterally cauterized with  the PK.  The ureters were identified well out of the field of dissection.     The uterus was then removed by circumferential incision around on the Koh ring at the level of the reflection of the vagina onto the cervix.  Hemostasis was achieved with additional cautery and checked with the insufflation pressure lowered 2 times.   The vaginal cuff was closed with a nine inch 0-V loc stitch in running fashion with 2 double back stitches.  The ureters were well away from the cuff angles and were peristalsing during the entire case.     The Robot was then undocked and I scrubbed back in.   Irrigation was performed and all instruments removed after the abdomen had been carefully desufflated.  The 12 cm camera port was closed with a running stitch of 2-0 vicryl.  The skin incisions were closed with subcuticular stitches of 3-0 vicryl Rapide and Dermabond.  Cystoscopy was not performed as the ureters were seen during the entire procedure and the urine was clear.  The patient taken to the recovery room in stable condition.  Avana Kreiser A

## 2012-01-02 NOTE — Anesthesia Preprocedure Evaluation (Signed)

## 2012-01-02 NOTE — OR Nursing (Signed)
rumi inserted at 0839; trocars inserted at 0917; Robot docked at [phone]; surgeon at console at [phone].

## 2012-01-02 NOTE — Progress Notes (Addendum)
Patient is eating, ambulating, and not voided yet- cath out only 2 hours ago.  Pain control is good.  BP 123/79  Pulse 84  Temp(Src) 98.1 F (36.7 C) (Oral)  Resp 16  Ht 5' 4.5" (1.638 m)  Wt 76.204 kg (168 lb)  BMI 28.39 kg/m2  SpO2 100%  LMP 12/30/2011  lungs:   clear to auscultation cor:    RRR Abdomen:  soft, appropriate tenderness, incisions intact and without erythema or exudate. ex:    no cords   Lab Results  Component Value Date   WBC 4.9 12/26/2011   HGB 13.0 12/26/2011   HCT 37.9 12/26/2011   MCV 91.1 12/26/2011   PLT 173 12/26/2011    A/P  Routine care.  Expect d/c per plan.

## 2012-01-02 NOTE — Anesthesia Postprocedure Evaluation (Signed)
Anesthesia Post Note  Patient: Brianna Fry  Procedure(s) Performed: Procedure(s) (LRB): ROBOTIC ASSISTED LAPAROSCOPIC VAGINAL HYSTERECTOMY (N/A) ROBOTIC ASSISTED BILATERAL SALPINGO OOPHERECTOMY (N/A)  Anesthesia type: General  Patient location: PACU  Post pain: Pain level controlled  Post assessment: Post-op Vital signs reviewed  Last Vitals:  Filed Vitals:   01/02/12 1145  BP: 126/78  Pulse: 68  Temp:   Resp: 16    Post vital signs: Reviewed  Level of consciousness: sedated  Complications: No apparent anesthesia complicationsfj

## 2012-01-02 NOTE — Addendum Note (Signed)
Addendum  created 01/02/12 1157 by Karington Zarazua R Walker, CRNA   Modules edited:Anesthesia Medication Administration    

## 2012-01-02 NOTE — Transfer of Care (Signed)
Immediate Anesthesia Transfer of Care Note  Patient: Brianna Fry  Procedure(s) Performed: Procedure(s) (LRB): ROBOTIC ASSISTED LAPAROSCOPIC VAGINAL HYSTERECTOMY (N/A) ROBOTIC ASSISTED BILATERAL SALPINGO OOPHERECTOMY (N/A)  Patient Location: PACU  Anesthesia Type: General  Level of Consciousness: awake, alert  and sedated  Airway & Oxygen Therapy: Patient Spontanous Breathing and Patient connected to nasal cannula oxygen  Post-op Assessment: Report given to PACU RN and Post -op Vital signs reviewed and stable  Post vital signs: Reviewed and stable  Complications: No apparent anesthesia complications

## 2012-01-02 NOTE — Op Note (Signed)
01/02/2012  11:09 AM  PATIENT:  Brianna Fry  40 y.o. female  PRE-OPERATIVE DIAGNOSIS:  UTERINE FIBROIDS  POST-OPERATIVE DIAGNOSIS:  uterine fibroids  PROCEDURE:  Procedure(s) (LRB): ROBOTIC ASSISTED LAPAROSCOPIC VAGINAL HYSTERECTOMY (N/A)   SURGEON:  Surgeon(s) and Role:    * Hadi Dubin A Jonathan Kirkendoll, MD - Primary    * Sidney Callahan, DO - Assisting  ANESTHESIA:   general  EBL:  Total I/O In: 1000 [I.V.:1000] Out: -   SPECIMEN:  Source of Specimen:  uterus, cervix  DISPOSITION OF SPECIMEN:  PATHOLOGY  COUNTS:  YES  PLAN OF CARE: Admit for overnight observation  PATIENT DISPOSITION:  PACU - hemodynamically stable.   Delay start of Pharmacological VTE agent (>24hrs) due to surgical blood loss or risk of bleeding: not applicable  Preoperative Diagnosis:  Symptomatic fibroid uterus.  Postoperative Diagnosis:  Same.  Procedure:  Robotic Assisted Supracervical Hysterectomy with bilateral salpingectomies and Left oophorectomy.  Attending: Nishat Livingston A  Assistant: Dakisha Schoof Bigelman, PA  Anesthesia:  General endotracheal.  Estimated blood loss:  200 cc.  IV fluids:  1500 cc.  Urine:  700 cc.  Complications:  None.  Findings:  15 weeks bulky uterus with multiple fibroids.  The ureters were identified during multiple points of the case and were always out of the field of dissection.    Medications:  Cefotan.      Technique:  After adequate anesthesia was achieved the patient was positioned, prepped and draped in usual sterile fashion.  A speculum was placed in the vagina and the cervix dilated with pratt dilators.  The cervix was secured with a stitch of 2-0 vicryl and the 8 cm Rumi and 3 cm Koh ring were assembled and placed in proper fashion.  The  Speculum was removed and the bladder catheterized with a foley.    Attention was turned to the abdomen where a 1 cm incision was made 5 cm above the umbilicus.  The fascia was tented up and incised with a  scalpel.  Each angle was secured with a stitch of 2-vicryl and the peritoneum entered into bluntly with my finger.  The long 12 mm trocar was placed and the other four trocar sites were marked out, all approximately 10 cm from each other and the camera.  Three 8.5 mm trocars were placed on either side of the camera port and 3 cm above the iliac crest on the right side.  A 5 mm assistant port was placed opposite the left lower trocar.  All trocars were inserted under direct visualization of the camera.  The patient was placed in trendelenburg and then the Robot docked.  The PK forceps were placed on arm 2, the Prograsp on arm 3 and the Hot shears on arm 1 and introduced under direct visualization of the camera  I then broke scrub and sat down at the console.  The above findings were noted and the ureters identified well out of the field of dissection.  The round ligament on the right side was cauterized and incised, followed by the tube and uterine ovarian ligament.   Each was cauterized with the PK and then divided with the scissors.  The posterior broad ligament was then divided with the hot shears until the uterosacral ligament.  The anterior leaf was then incised at the reflection of the vessico-uterine junction and the bladder retracted inferiorly.The same procedure was performed on the left side.   At the level of the internal os, the uterine arteries were bilaterally cauterized with   the PK.  The ureters were identified well out of the field of dissection.     The uterus was then removed by circumferential incision around on the Koh ring at the level of the reflection of the vagina onto the cervix.  Hemostasis was achieved with additional cautery and checked with the insufflation pressure lowered 2 times.   The vaginal cuff was closed with a nine inch 0-V loc stitch in running fashion with 2 double back stitches.  The ureters were well away from the cuff angles and were peristalsing during the entire case.     The Robot was then undocked and I scrubbed back in.   Irrigation was performed and all instruments removed after the abdomen had been carefully desufflated.  The 12 cm camera port was closed with a running stitch of 2-0 vicryl.  The skin incisions were closed with subcuticular stitches of 3-0 vicryl Rapide and Dermabond.  Cystoscopy was not performed as the ureters were seen during the entire procedure and the urine was clear.  The patient taken to the recovery room in stable condition.  Rory Montel A     

## 2012-01-02 NOTE — Addendum Note (Signed)
Addendum  created 01/02/12 1157 by Gertie Fey, CRNA   Modules edited:Anesthesia Medication Administration

## 2012-01-03 ENCOUNTER — Encounter (HOSPITAL_COMMUNITY): Payer: Self-pay | Admitting: Obstetrics and Gynecology

## 2012-01-03 LAB — CBC
HCT: 31.9 % — ABNORMAL LOW (ref 36.0–46.0)
MCHC: 33.9 g/dL (ref 30.0–36.0)
MCV: 91.4 fL (ref 78.0–100.0)
RDW: 11.9 % (ref 11.5–15.5)

## 2012-01-03 MED ORDER — OXYCODONE-ACETAMINOPHEN 5-325 MG PO TABS: 1.0000 | ORAL_TABLET | ORAL | Status: AC | PRN

## 2012-01-03 MED ORDER — ONDANSETRON HCL 4 MG PO TABS: 4.0000 mg | ORAL_TABLET | Freq: Four times a day (QID) | ORAL | Status: AC | PRN

## 2012-01-03 NOTE — Anesthesia Postprocedure Evaluation (Signed)
  Anesthesia Post-op Note  Patient: Brianna Fry  Procedure(s) Performed: Procedure(s) (LRB): ROBOTIC ASSISTED LAPAROSCOPIC VAGINAL HYSTERECTOMY (N/A) ROBOTIC ASSISTED BILATERAL SALPINGO OOPHERECTOMY (N/A)  Patient Location: [facility]  Anesthesia Type: General  Level of Consciousness: awake, alert  and oriented  Airway and Oxygen Therapy: Patient Spontanous Breathing  Post-op Pain: none  Post-op Assessment: Post-op Vital signs reviewed and Patient's Cardiovascular Status Stable  Post-op Vital Signs: Reviewed and stable  Complications: No apparent anesthesia complications

## 2012-01-03 NOTE — Addendum Note (Signed)
Addendum  created 01/03/12 0817 by Shanon Payor, CRNA   Modules edited:Notes Section

## 2012-01-03 NOTE — Discharge Summary (Signed)
Physician Discharge Summary  Patient ID: Brianna Fry MRN: [id number] DOB/AGE: 11-15-1970 41 y.o.  Admit date: 01/02/2012 Discharge date: 01/03/2012  Admission Diagnoses:symptomatic fibroid uterus  Discharge Diagnoses: same Active Problems:  * No active hospital problems. *    Discharged Condition: good  Hospital Course: Uncomplicated surgery; d/ced on pod 1  Consults: None  Significant Diagnostic Studies: labs: cbc CBC    Component Value Date/Time   WBC 12.7* 01/03/2012 0525   RBC 3.49* 01/03/2012 0525   HGB 10.8* 01/03/2012 0525   HCT 31.9* 01/03/2012 0525   PLT 171 01/03/2012 0525   MCV 91.4 01/03/2012 0525   MCH 30.9 01/03/2012 0525   MCHC 33.9 01/03/2012 0525   RDW 11.9 01/03/2012 0525      Treatments: surgery: Robotic TLH  Discharge Exam: Blood pressure 128/79, pulse 76, temperature 98.3 F (36.8 C), temperature source Oral, resp. rate 18, height 5' 4.5" (1.638 m), weight 76.204 kg (168 lb), last menstrual period 12/30/2011, SpO2 97.00%. see progress note  Disposition: 01-Home or Self Care  Discharge Orders    Future Orders Please Complete By Expires   Diet - low sodium heart healthy      Discharge instructions      Comments:   No driving on narcotics, no sexual activity for 2 weeks.   Increase activity slowly      May shower / Bathe      Comments:   Shower, no bath for 2 weeks.   Sexual Activity Restrictions      Comments:   No sexual activity for 2 weeks.   Remove dressing in 24 hours      Call MD for:  temperature >100.4        Medication List  As of 01/03/2012  7:55 AM   TAKE these medications         calcium carbonate 500 MG chewable tablet   Commonly known as: TUMS - dosed in mg elemental calcium   Chew 2 tablets by mouth daily.      ibuprofen 200 MG tablet   Commonly known as: ADVIL,MOTRIN   Take 400-600 mg by mouth as needed. Headache or pain      mulitivitamin with minerals Tabs   Take 1 tablet by mouth daily.      ondansetron 4 MG  tablet   Commonly known as: ZOFRAN   Take 1 tablet (4 mg total) by mouth every 6 (six) hours as needed for nausea.      oxyCODONE-acetaminophen 5-325 MG per tablet   Commonly known as: PERCOCET   Take 1-2 tablets by mouth every 4 (four) hours as needed (moderate to severe pain (when tolerating fluids)).           Follow-up Information    Schedule an appointment as soon as possible for a visit with Quill Grinder A, MD.   Contact information:   [address] [place] [address] [phone]          Signed: Loney Laurence 01/03/2012, 7:55 AM

## 2012-01-03 NOTE — Progress Notes (Signed)
Patient is eating, ambulating,  voiding.  Pain control is good.  BP 128/79  Pulse 76  Temp(Src) 98.3 F (36.8 C) (Oral)  Resp 18  Ht 5' 4.5" (1.638 m)  Wt 76.204 kg (168 lb)  BMI 28.39 kg/m2  SpO2 97%  LMP 12/30/2011  lungs:   clear to auscultation cor:    RRR Abdomen:  soft, appropriate tenderness, incisions intact and without erythema or exudate. ex:    no cords   Lab Results  Component Value Date   WBC 12.7* 01/03/2012   HGB 10.8* 01/03/2012   HCT 31.9* 01/03/2012   MCV 91.4 01/03/2012   PLT 171 01/03/2012    A/P  Routine care.  Expect d/c per plan.

## 2012-10-28 ENCOUNTER — Other Ambulatory Visit: Payer: Self-pay | Admitting: Obstetrics and Gynecology

## 2013-01-17 ENCOUNTER — Emergency Department (HOSPITAL_COMMUNITY)
Admission: EM | Admit: 2013-01-17 | Discharge: 2013-01-17 | Disposition: A | Discharge status: Home / Self Care | Payer: 59 | Source: Home / Self Care | Attending: Family Medicine | Admitting: Family Medicine

## 2013-01-17 ENCOUNTER — Encounter (HOSPITAL_COMMUNITY): Payer: Self-pay | Admitting: *Deleted

## 2013-01-17 DIAGNOSIS — J029 Acute pharyngitis, unspecified: Secondary | ICD-10-CM

## 2013-01-17 LAB — POCT RAPID STREP A: Streptococcus, Group A Screen (Direct): NEGATIVE

## 2013-01-17 NOTE — ED Provider Notes (Signed)
History     CSN: [id number]  Arrival date & time 01/17/13  1101   First MD Initiated Contact with Patient 01/17/13 1120      Chief Complaint  Patient presents with  . Sore Throat    Patient is a 42 y.o. female presenting with pharyngitis. The history is provided by the patient.  Sore Throat This is a new problem. The current episode started 2 days ago. The problem occurs constantly. The problem has been resolved. Pertinent negatives include no abdominal pain, no headaches and no shortness of breath. The symptoms are aggravated by swallowing. She has tried nothing for the symptoms.  Pt reports 2 day h/o sore throat. Denies other associated URI type sx's. States yesterday she noted small white spots on her tonsils and was concerned it might be strep throat. Denies fever.   Past Medical History  Diagnosis Date  . GERD (gastroesophageal reflux disease)     controlled now with diet    Past Surgical History  Procedure Laterality Date  . Cryotherapy    . Svd      x 1  . Robotic assisted lap vaginal hysterectomy  01/02/2012    Procedure: ROBOTIC ASSISTED LAPAROSCOPIC VAGINAL HYSTERECTOMY;  Surgeon: Loney Laurence, MD;  Location: [facility];  Service: Gynecology;  Laterality: N/A;    No family history on file.  History  Substance Use Topics  . Smoking status: Never Smoker   . Smokeless tobacco: Never Used  . Alcohol Use: Yes     Comment: occasional    OB History   Grav Para Term Preterm Abortions TAB SAB Ect Mult Living                  Review of Systems  Respiratory: Negative for shortness of breath.   Gastrointestinal: Negative for abdominal pain.  Neurological: Negative for headaches.  All other systems reviewed and are negative.    Allergies  Review of patient's allergies indicates no known allergies.  Home Medications   Current Outpatient Rx  Name  Route  Sig  Dispense  Refill  . calcium carbonate (TUMS - DOSED IN MG ELEMENTAL CALCIUM) 500 MG chewable  tablet   Oral   Chew 2 tablets by mouth daily.         Marland Kitchen ibuprofen (ADVIL,MOTRIN) 200 MG tablet   Oral   Take 400-600 mg by mouth as needed. Headache or pain         . Multiple Vitamin (MULITIVITAMIN WITH MINERALS) TABS   Oral   Take 1 tablet by mouth daily.           BP 107/73  Pulse 75  Temp(Src) 97.8 F (36.6 C) (Oral)  Resp 16  SpO2 98%  LMP 12/25/2011  Physical Exam  Constitutional: She is oriented to person, place, and time. She appears well-developed and well-nourished.  HENT:  Right Ear: Tympanic membrane, external ear and ear canal normal.  Left Ear: Tympanic membrane, external ear and ear canal normal.  Nose: Nose normal.  Mouth/Throat: Uvula is midline and mucous membranes are normal.  Very small well defines lesions on tonsils. No associated erythema, swelling or other signs c/w infectious process. Findings c/w tonsilloliths.  Eyes: Conjunctivae are normal.  Neck: Neck supple.  Cardiovascular: Normal rate.   Pulmonary/Chest: Effort normal.  Musculoskeletal: Normal range of motion.  Neurological: She is alert and oriented to person, place, and time.  Skin: Skin is warm and dry.  Psychiatric: She has a normal mood and affect.  ED Course  Procedures (including critical care time)  Labs Reviewed  POCT RAPID STREP A ([facility]RG CARE ONLY)   No results found.   1. Throat soreness       MDM  2 day h/o mild sore throat w/ small white "spots" on her tonsils. No other associated URI sx's. Findings on exam c/w tonsilloliths. Discussed ways to treat at home. Pt reassured.        Leanne Chang, NP 01/19/13 [phone]

## 2013-01-17 NOTE — ED Notes (Signed)
C/O "deep" right earache 3 days ago, which has improved, but started with sore throat/tonsils x 2 days.  States she "saw white spots" in back of throat; no exudate noted at present.  Denies fevers, congestion, or runny nose.  Has been gargling with salt water and taking Advil.

## 2013-01-22 NOTE — ED Provider Notes (Signed)
Medical screening examination/treatment/procedure(s) were performed by resident physician or non-physician practitioner and as supervising physician I was immediately available for consultation/collaboration.   Rebekha Diveley DOUGLAS MD.   Brittain Hosie D Jona Erkkila, MD 01/22/13 1943 

## 2013-09-01 ENCOUNTER — Ambulatory Visit
Admission: RE | Admit: 2013-09-01 | Discharge: 2013-09-01 | Disposition: A | Discharge status: Home / Self Care | Payer: 59 | Source: Ambulatory Visit | Attending: Family Medicine | Admitting: Family Medicine

## 2013-09-01 ENCOUNTER — Other Ambulatory Visit: Payer: Self-pay | Admitting: Family Medicine

## 2013-09-01 DIAGNOSIS — M549 Dorsalgia, unspecified: Secondary | ICD-10-CM

## 2015-06-24 ENCOUNTER — Other Ambulatory Visit: Payer: Self-pay | Admitting: Obstetrics and Gynecology

## 2015-06-28 LAB — CYTOLOGY - PAP

## 2016-12-05 DIAGNOSIS — Z0000 Encounter for general adult medical examination without abnormal findings: Secondary | ICD-10-CM | POA: Diagnosis not present

## 2017-11-15 DIAGNOSIS — Z803 Family history of malignant neoplasm of breast: Secondary | ICD-10-CM | POA: Diagnosis not present

## 2017-11-15 DIAGNOSIS — Z01419 Encounter for gynecological examination (general) (routine) without abnormal findings: Secondary | ICD-10-CM | POA: Diagnosis not present

## 2017-11-15 DIAGNOSIS — Z1231 Encounter for screening mammogram for malignant neoplasm of breast: Secondary | ICD-10-CM | POA: Diagnosis not present

## 2018-06-21 ENCOUNTER — Encounter (HOSPITAL_COMMUNITY): Payer: Self-pay

## 2018-06-21 ENCOUNTER — Emergency Department (HOSPITAL_COMMUNITY)
Admission: EM | Admit: 2018-06-21 | Discharge: 2018-06-21 | Disposition: A | Discharge status: Home / Self Care | Payer: Commercial Managed Care - HMO | Attending: Emergency Medicine | Admitting: Emergency Medicine

## 2018-06-21 ENCOUNTER — Other Ambulatory Visit: Payer: Self-pay

## 2018-06-21 DIAGNOSIS — Y939 Activity, unspecified: Secondary | ICD-10-CM | POA: Insufficient documentation

## 2018-06-21 DIAGNOSIS — Z79899 Other long term (current) drug therapy: Secondary | ICD-10-CM | POA: Insufficient documentation

## 2018-06-21 DIAGNOSIS — Y929 Unspecified place or not applicable: Secondary | ICD-10-CM | POA: Diagnosis not present

## 2018-06-21 DIAGNOSIS — M542 Cervicalgia: Secondary | ICD-10-CM | POA: Insufficient documentation

## 2018-06-21 DIAGNOSIS — Y999 Unspecified external cause status: Secondary | ICD-10-CM | POA: Diagnosis not present

## 2018-06-21 MED ORDER — METHOCARBAMOL 500 MG PO TABS: 500.0000 mg | ORAL_TABLET | Freq: Two times a day (BID) | ORAL | 0 refills | Status: AC

## 2018-06-21 MED ORDER — NAPROXEN 375 MG PO TABS: 375.0000 mg | ORAL_TABLET | Freq: Two times a day (BID) | ORAL | 0 refills | Status: AC
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
## 2018-12-18 DIAGNOSIS — Z1231 Encounter for screening mammogram for malignant neoplasm of breast: Secondary | ICD-10-CM | POA: Diagnosis not present

## 2018-12-25 DIAGNOSIS — Z01419 Encounter for gynecological examination (general) (routine) without abnormal findings: Secondary | ICD-10-CM | POA: Diagnosis not present

## 2020-07-06 ENCOUNTER — Other Ambulatory Visit: Payer: Self-pay

## 2020-07-06 DIAGNOSIS — Z20822 Contact with and (suspected) exposure to covid-19: Secondary | ICD-10-CM

## 2020-07-08 LAB — SARS-COV-2, NAA 2 DAY TAT

## 2020-07-08 LAB — NOVEL CORONAVIRUS, NAA: SARS-CoV-2, NAA: NOT DETECTED
